# Patient Record
Sex: Female | Born: 1935 | Race: Black or African American | Hispanic: No | Marital: Single
Health system: Southern US, Community
[De-identification: ages and names within clinical notes are randomized; demographics above are authoritative.]

---

## 2005-04-30 ENCOUNTER — Ambulatory Visit: Payer: Self-pay | Admitting: Family Medicine

## 2006-05-01 ENCOUNTER — Ambulatory Visit: Payer: Self-pay | Admitting: Family Medicine

## 2008-11-21 ENCOUNTER — Emergency Department: Payer: Self-pay

## 2009-02-01 ENCOUNTER — Ambulatory Visit: Payer: Self-pay | Admitting: Family Medicine

## 2012-09-17 ENCOUNTER — Observation Stay: Payer: Self-pay | Admitting: Internal Medicine

## 2012-09-17 LAB — COMPREHENSIVE METABOLIC PANEL
Albumin: 3.4 g/dL (ref 3.4–5.0)
Alkaline Phosphatase: 69 U/L (ref 50–136)
Calcium, Total: 9.2 mg/dL (ref 8.5–10.1)
Chloride: 107 mmol/L (ref 98–107)
Co2: 24 mmol/L (ref 21–32)
Creatinine: 0.94 mg/dL (ref 0.60–1.30)
EGFR (African American): >60
EGFR (Non-African Amer.): 54 — ABNORMAL LOW
Glucose: 43 mg/dL — ABNORMAL LOW (ref 65–99)
Osmolality: 276 (ref 275–301)
SGPT (ALT): 16 U/L (ref 12–78)
Sodium: 139 mmol/L (ref 136–145)

## 2012-09-17 LAB — CBC
MCH: 28.8 pg (ref 26.0–34.0)
MCHC: 32.9 g/dL (ref 32.0–36.0)
RBC: 3.76 10*6/uL — ABNORMAL LOW (ref 3.80–5.20)
RDW: 15.8 % — ABNORMAL HIGH (ref 11.5–14.5)

## 2012-09-17 LAB — URINALYSIS, COMPLETE
Bilirubin,UR: NEGATIVE
Blood: NEGATIVE
Glucose,UR: 50 mg/dL (ref 0–75)
Nitrite: NEGATIVE
Protein: NEGATIVE
Specific Gravity: 1.008 (ref 1.003–1.030)
WBC UR: 1 /HPF (ref 0–5)

## 2012-09-17 LAB — CK TOTAL AND CKMB (NOT AT ARMC): CK, Total: 99 U/L (ref 21–215)

## 2012-09-18 LAB — BASIC METABOLIC PANEL
Anion Gap: 7 (ref 7–16)
BUN: 12 mg/dL (ref 7–18)
Calcium, Total: 8.3 mg/dL — ABNORMAL LOW (ref 8.5–10.1)
Chloride: 105 mmol/L (ref 98–107)
Co2: 23 mmol/L (ref 21–32)
Osmolality: 271 (ref 275–301)
Sodium: 135 mmol/L — ABNORMAL LOW (ref 136–145)

## 2012-09-18 LAB — CBC WITH DIFFERENTIAL/PLATELET
Basophil #: 0 10*3/uL (ref 0.0–0.1)
Basophil %: 0.5 %
Eosinophil %: 2.8 %
HCT: 32.5 % — ABNORMAL LOW (ref 35.0–47.0)
Lymphocyte #: 3 10*3/uL (ref 1.0–3.6)
Lymphocyte %: 34.4 %
MCH: 28.6 pg (ref 26.0–34.0)
Monocyte #: 0.8 x10 3/mm (ref 0.2–0.9)
Monocyte %: 9.3 %
Neutrophil #: 4.7 10*3/uL (ref 1.4–6.5)
Neutrophil %: 53 %
Platelet: 250 10*3/uL (ref 150–440)
RBC: 3.71 10*6/uL — ABNORMAL LOW (ref 3.80–5.20)

## 2012-09-18 LAB — LIPID PANEL: Triglycerides: 83 mg/dL (ref 0–200)

## 2012-09-18 LAB — HEMOGLOBIN A1C: Hemoglobin A1C: 8.2 % — ABNORMAL HIGH (ref 4.2–6.3)

## 2013-09-24 IMAGING — CR DG CHEST 1V PORT
1 series · 1 of 1 positions shown · non-contrast
Comparison: none

REASON FOR EXAM: weakness
COMMENTS:

PROCEDURE:     DXR - DXR PORTABLE CHEST SINGLE VIEW  - September 18, 2012  [DATE]
RESULT:     Comparison: 11/21/2008

[ap]
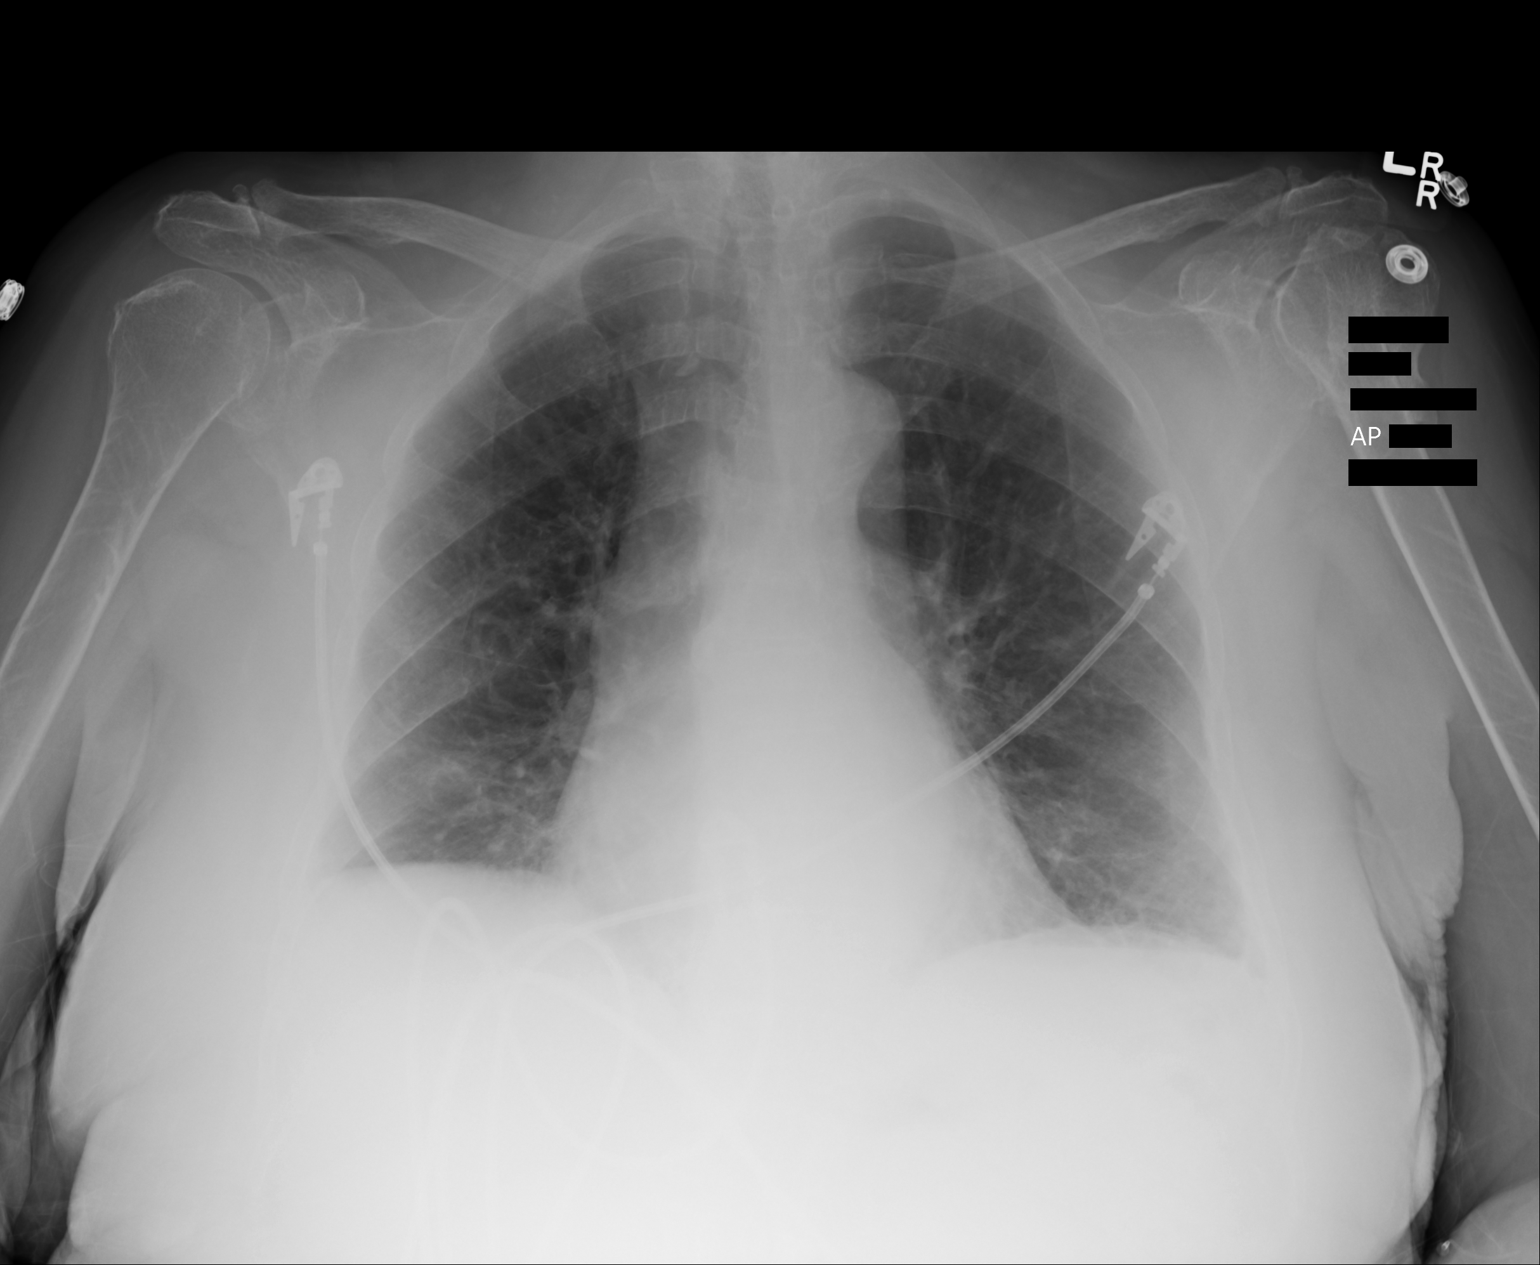

[1 of 1 positions shown; findings below may reference images not displayed]

FINDINGS: Single portable AP chest radiograph is provided.  There is no focal
parenchymal opacity, pleural effusion, or pneumothorax. Normal
cardiomediastinal silhouette. The osseous structures are unremarkable.
IMPRESSION: No acute disease of the che[REDACTED]

## 2014-10-21 NOTE — Consult Note (Signed)
Chief Complaint and History:  Referring Physician Dr. Jacques NavyAhmadzia   Chief Complaint Hypoglycemia   Allergies:  No Known Allergies:   Assessment/Plan:  Assessment/Plan Patient was seen, interviewed and examined. She has type 2 diabetes and is on a regimen of glipizide XL 10 mg bid and Victoza 1.8 sq daily. Diabetes is uncontrolled. Last A1c in 07/2012 was >9%. She does not monitor her sugars. Sugar was in the 40s yesterday afternoon and then again overnight so she came to ED. Sugars repeated dropped despite amps of dextrose in ED. She was started on IV D10 and is admitted for OBS. She confirms taking glipizide XL 10 mg yest AM, no other DM meds yesterday. not sure if she took only one glipizide. Lives with grandson but manages meds on her own.  A/ Hypoglycemia, likely due to use of sulfonylurea. May have taken extra? No evidence of infection. Does not seem to have demential.  P/ Hold DM meds Continue IV Dextrose. Once BS are >150 consistently, wean dextrose. Check FSBS q2 hours. Agree with NovoLog SSI in case sugars are high  I will follow with you. Full consult was dictated.   Electronic Signatures: Raj JanusSolum, Chlora Mcbain M (MD)  (Signed 20-Mar-14 13:41)  Authored: Chief Complaint and History, ALLERGIES, Assessment/Plan   Last Updated: 20-Mar-14 13:41 by Raj JanusSolum, Lailani Tool M (MD)

## 2014-10-21 NOTE — Consult Note (Signed)
PATIENT NAME:  Andrea Morse, Andrea Morse MR#:  [id number] DATE OF BIRTH:  02-07-1921  DATE OF CONSULTATION:  09/17/2012  REFERRING PHYSICIAN:  Vivien Presto, MD CONSULTING PHYSICIAN:  A. Lavone Orn, MD PRIMARY CARE PHYSICIAN: Dion Body, MD   CHIEF COMPLAINT: Hypoglycemia.   HISTORY OF PRESENT ILLNESS: This is an 81 year old female seen in consultation at the request of Dr. Bridgette Habermann with hypoglycemia. She has a history of hypertension, diabetes, hyperlipidemia. Her outpatient diabetes regimen includes glipizide extended-release 10 mg b.i.d. and Victoza 1.8 mg subcutaneous daily. She has been in her usual state of health and yesterday afternoon developed some weakness. This was around 1 to 2:00 p.m. Checked her blood sugars and found sugar had dropped into the 40s. She had something to eat. She was able to improve her blood sugar into the 70s. She ate breakfast, lunch and supper. That evening, blood sugars dropped again and she was woken from sleep with blood sugars in the 40s. She was brought to the ED where sugars were found again to be in the 40s. She was treated with D50 repeatedly; however, sugars would briefly improve and then drop again, so she was placed on D10 IV infusion. She is receiving D10 at a rate of 70 mL/h. She confirms she took her morning glipizide XL 10 mg. She did not take any glipizide in the evening and she did not take any Victoza yesterday, due to the concerns of low sugars. She denies any chest pain, shortness of breath or cough. She denies any recent fever. She denies dysuria or hematuria. She denies any recent abdominal pain or nausea, vomiting.   PAST MEDICAL HISTORY:  1.  Diabetes mellitus.  2.  Hypertension.  3.  Hyperlipidemia.  4.  Osteoporosis.  5.  Glaucoma.  ALLERGIES: No known drug allergies.   PAST SURGICAL HISTORY: Hysterectomy and oophorectomy.   OUTPATIENT MEDICATIONS:  1.  Victoza 1.8 mg subcutaneous daily.  2.  Glipizide extended-release 10 mg b.i.d.   3.  Alendronate 70 mg once weekly.  4.  HCTZ/moexipril 25/15 mg once daily.  5.  Simvastatin 10 mg at bedtime.  6.  Aspirin 81 mg daily.  7.  Flexeril 10 mg at bedtime p.r.n.   FAMILY HISTORY: Noncontributory.   SOCIAL HISTORY: The patient lives with a grandson. No tobacco or alcohol use.   REVIEW OF SYSTEMS:    HEENT: No blurred vision. No sore throat.  NECK: No neck pain or dysphagia.  CARDIAC: No chest pain. No palpitations.  PULMONARY: No cough. No shortness of breath.  ABDOMEN: No abdominal pain. Good appetite. No recent change in bowel habits.  EXTREMITIES: Denies leg swelling.  SKIN: Denies rash or pruritus.  ENDOCRINE: Denies heat or cold intolerance.  HEMATOLOGIC: Denies easy bruisability or recent bleeding.  NEUROLOGIC: Denies tremor or recent falls.   PHYSICAL EXAMINATION:  VITAL SIGNS: Height 62.9 inches, weight 140 pounds, BMI 24.8, temperature 97.7, pulse 80, respiratory rate 18, blood pressure 126/63, o2 sat 99% on room air.  GENERAL: Well-developed, well-nourished African American female in no acute distress.  HEENT: EOMI. Oropharynx is clear. Mucous membranes moist. Edentulous.  NECK: Supple. No thyromegaly.  CARDIAC: No carotid bruit. Regular rate and rhythm.  PULMONARY: Clear to auscultation bilaterally. No wheeze. Good inspiratory effort.  ABDOMEN: Diffusely soft, nontender, nondistended.  EXTREMITIES: No edema is present. Normal motor tone.  SKIN: No rash. No dermatopathy.  PSYCHIATRIC: Alert and oriented, calm and cooperative.   LABORATORY DATA: Glucose 43, BUN 16, creatinine 0.94, potassium 3.7, sodium 139,  eGFR greater than 60, calcium 9.2, CK 99, CK-MB 1.2, AST 19, ALT 16, albumin 3.4. WBC 11.5, hematocrit 33, platelets 262. Urinalysis notable for no ketones, bilirubin, nitrite, leukocyte esterase. Glucose in urine is 50 mg/dL. No urine protein.   RADIOLOGY: None.  ASSESSMENT: An 81 year old female with type 2 diabetes presenting with severe recurrent  hypoglycemia. Presumably this is due to use of sulfonylurea.   RECOMMENDATIONS:  1.  Hold all diabetes medications.  2.  Continue dextrose. Once blood sugars are consistently over 150 for 2 to 3 hours on her current rate, I would then decrease the rate to 50 mL/h and then titrate down every 2 to 3 hours as tolerated to keep blood sugars over 70.  3.  Encourage good p.o. intake.  4.  Should blood sugars remain stable over the next 24 hours, I would again keep her off her diabetes medications initially. I would counsel her on the importance of regular monitoring of blood sugars and then arrange for close outpatient followup within 1 week to then determine which medications could be restarted. I would tentatively anticipate not restarting the sulfonylurea. Victoza could be considered. Other reasonable diabetes medications to be considered could include metformin, pioglitazone, and the DPP-4 inhibitors (Januvia and Onglyza).   Thank you for the kind request for consultation. I will follow along with you.    ____________________________ A. Lavone Orn, MD ams:jm D: 09/17/2012 13:35:53 ET T: 09/17/2012 13:53:23 ET JOB#: [id number]  cc: A. Lavone Orn, MD, <Dictator> Sherlon Handing MD ELECTRONICALLY SIGNED 09/22/2012 12:51
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
## 2019-08-09 ENCOUNTER — Other Ambulatory Visit: Payer: Self-pay

## 2019-08-09 ENCOUNTER — Ambulatory Visit: Payer: Medicare Other | Attending: Internal Medicine

## 2019-08-09 DIAGNOSIS — Z23 Encounter for immunization: Secondary | ICD-10-CM | POA: Insufficient documentation

## 2019-08-09 NOTE — Progress Notes (Signed)
   Covid-19 Vaccination Clinic  Name:  Andrea Morse    MRN: [id number] DOB: 09-29-1920  08/09/2019  Andrea Morse was observed post Covid-19 immunization for 15 minutes without incidence. She was provided with Vaccine Information Sheet and instruction to access the V-Safe system.   Andrea Morse was instructed to call 911 with any severe reactions post vaccine: Marland Kitchen Difficulty breathing  . Swelling of your face and throat  . A fast heartbeat  . A bad rash all over your body  . Dizziness and weakness    Immunizations Administered    Name Date Dose VIS Date Route   Moderna COVID-19 Vaccine 08/09/2019 10:43 AM 0.5 mL 06/01/2019 Intramuscular   Manufacturer: Moderna   Lot: [id number]   NDC: 46190-122-24

## 2019-09-08 ENCOUNTER — Ambulatory Visit: Payer: Medicare Other | Attending: Internal Medicine

## 2019-09-08 DIAGNOSIS — Z23 Encounter for immunization: Secondary | ICD-10-CM | POA: Insufficient documentation

## 2019-09-08 NOTE — Progress Notes (Signed)
   Covid-19 Vaccination Clinic  Name:  Andrea Morse    MRN: [id number] DOB: [birth year]  09/08/2019  [name] was observed post Covid-19 immunization for 15 minutes without incident. She was provided with Vaccine Information Sheet and instruction to access the V-Safe system.   Ms. Min was instructed to call 911 with any severe reactions post vaccine: Marland Kitchen Difficulty breathing  . Swelling of face and throat  . A fast heartbeat  . A bad rash all over body  . Dizziness and weakness   Immunizations Administered    Name Date Dose VIS Date Route   Moderna COVID-19 Vaccine 09/08/2019  9:35 AM 0.5 mL 06/01/2019 Intramuscular   Manufacturer: Moderna   Lot: [id number]   NDC: 28366-294-76

## 2021-11-02 ENCOUNTER — Telehealth: Payer: Self-pay | Admitting: Nurse Practitioner

## 2021-11-02 NOTE — Telephone Encounter (Signed)
Spoke with patient's granddaughter [facility]) and we discussed the Palliative referral/services and she was in agreement with beginning services for patient.  I have scheduled a MyChart Consult for 11/08/21 @ 1:30 PM ?

## 2021-11-08 ENCOUNTER — Encounter: Payer: Self-pay | Admitting: Nurse Practitioner

## 2021-11-08 ENCOUNTER — Telehealth: Payer: Medicare Other | Admitting: Nurse Practitioner

## 2021-11-08 DIAGNOSIS — Z515 Encounter for palliative care: Secondary | ICD-10-CM

## 2021-11-08 DIAGNOSIS — R0602 Shortness of breath: Secondary | ICD-10-CM

## 2021-11-08 NOTE — Progress Notes (Addendum)
? ? ?Manufacturing engineer ?Community Palliative Care Consult Note ?Telephone: [phone]  ?Fax: [phone]  ? ? ?Date of encounter: 11/08/21 ?2:18 PM ?PATIENT NAME: Andrea Morse ?[address] ?[place] [address]   ?[phone] (home)  ?DOB: 04-Jul-1920 ?MRN: [id number] ?PRIMARY CARE PROVIDER:    ?Dion Body, MD ?[place] [facility] ?[place],  [place] [address] ?[phone] ? ?RESPONSIBLE PARTY:    ?Contact Information   ? ? Name Relation Home Work Mobile  ? Andrea Morse Granddaughter   [phone]  ? Andrea Morse  [phone]    ? ?  ? ?Due to the COVID-19 crisis, this visit was done via telemedicine from my office and it was initiated and consent by this patient and or family. ? ?I connected with Granddaughter, Andrea Morse with  Andrea Morse OR [employer] on 11/08/21 by a video enabled telemedicine application and verified that I am speaking with the correct person using two identifiers. ?  ?I discussed the limitations of evaluation and management by telemedicine. The patient expressed understanding and agreed to proceed.  Palliative Care was asked to follow this patient by consultation request of  Dion Body, MD to address advance care planning and complex medical decision making. This is a follow up visit.                              ?ASSESSMENT AND PLAN / RECOMMENDATIONS:  ?Symptom Management/Plan: ?1. Advance Care Planning;  DNR ?2. Goals of Care: Goals include to maximize quality of life and symptom management. Our advance care planning conversation included a discussion about:    ?The value and importance of advance care planning  ?Exploration of personal, cultural or spiritual beliefs that might influence medical decisions  ?Exploration of goals of care in the event of a sudden injury or illness  ?Identification and preparation of a healthcare agent  ?Review and updating or creation of an advance directive document. ?3. Shortness of breath, continues O2,  stable; will continue to monitor, follow, weights, edema. ?4. Palliative care encounter; Palliative care encounter; Palliative medicine team will continue to support patient, patient's family, and medical team. Visit consisted of counseling and education dealing with the complex and emotionally intense issues of symptom management and palliative care in the setting of serious and potentially life-threatening illness ? ?Follow up Palliative Care Visit: Palliative care will continue to follow for complex medical decision making, advance care planning, and clarification of goals. Return 3 weeks or prn. ? ?I spent 42 minutes providing this consultation. More than 50% of the time in this consultation was spent in counseling and care coordination. ?PPS: 40% ? ?Chief Complaint: Follow up palliative consult for complex medical decision making ? ?HISTORY OF PRESENT ILLNESS:  Andrea Morse is a 88 y.o. year old female  with multiple medical problems including DM, CKD, afib, anemia, HLD, osteoporosis, glaucoma, h/o sigmoid diverticulosis. I connected through my chart with Andrea Morse with Andrea Morse for f//u [facility] consult since Hospice d/c due to stability. Andrea Morse resides at home with her son Andrea Morse with granddaughter Andrea Morse visiting daily assisting with care. Andrea Morse is able to sit up on the side of the bed, continues to wear O2 supplemental. We talked about functional abilities, ros, symptoms, currently comfortable. No problems or concerns. [name] Andrea Morse is doing well eating, no Morse, no wounds, no infections, no recent hospitalizations. Medical goals reviewed, quality of life. Information given about Remote  Health incase more difficulty getting to primary provider's office. We talked about role pc in poc. Scheduled in person f/u visit in 3 weeks, [name] in agreement, scheduled. Therapeutic listening, emotional support provided. Questions answered ? ?Hospice d/c summary ?Female DOB:  December 18, 1920 ?Address: Phone: [phone] ?10-09-2021 ?DISCHARGE NARRATIVE ?SUMMARY: Andrea Morse is a 88 year old female with Hypertensive heart and chronic kidney disease stage 4, atrial fibrillation, DM ?type II, chronic hypoxic respiratory failure on supplemental oxygen, OP, glaucoma, and HLD. She progressively declined in function ?and cognition over the several months prior to hospice admission. She declined from Register 60% February 2021 when she was able to ?ambulate with a cane and have adequate oral intake, to PPS 40% on admission August 2021. She had increased visits made to her ?PCP over several months prior to hospice referral, with increased shortness of breath since July 2021, increased weakness and ?fatigue, spending > 50% of her day in bed, needing total care for ADLs and requiring bed baths on admission to hospice. Her urine ?output decreased and eGFR declined to 30 cc/min in July 2021. She declined to only eating 2 meals/day at recertification January ?2023 she would only eat 50% of dinner 3-4 days a week, and refusing to eat or only bites at other meals. Her appetite has since ?improved, and she now eats 100% of 3 meals per day with daily snacks. She lost weight from 124# February 2021 to 117# BMI ?22kg/m2 at [facility]" on hospice admission August 2021, lost to 109# November 2021, up to 111# February-April 2022, back down to ?109# June - August 2022, to 108# October 2022, up to 110# (with new scales) December 2022, to 106.4# January 2023, to now ?108.4# BMI 18.8kg/m2. Her MAC increased slightly from 22cm April 2022 to 23cm June 2022, then back down to 22cm ?August-October 2022, to 21.5cm since December 2022, and her thigh decreased from 38cm June 2022 to 36cm August 2022, to ?35.5cm October 2022, to 35cm December 2022, to 34cm January 2023. She is on continuous oxygen at 3LPM (was PRN December ?2021.) Her dyspnea has improved, and her oxygen saturation levels have been stable on 2LPM; She recently only  dropped O2 sats to ?86% on RA ambulating around room (previously would drop to low 80's on oxygen.) She is able to ambulate much better than at ?January 9675 recertification. Her PPS is 40%. She is not sleeping as much since seroquel was increased, now only sleeps 16 ?hours/day (improved from sleeping up to 18-20 hours per day at January 9163 recertification.) Her Stage 1 wound to heel has ?completely healed. She has had no Morse, no new wounds, nor infections; is more custodial care at this time than terminal. ?Granddaughter agrees with discharge, DR Netty Starring gave order for palliative care to follow on hospice discharge. ?Patient no longer qualifies for hospice evidenced by progressive clinical, cognitive, and functional improvement. She has improved ?appetite and subsequent weight gain, now weighs only 3# less now than she was last year at this time so weight is essentially stable. ?Her wounds have healed. She is more alert, sleeping less, and able to ambulate better, with stable lung function at this time, despite ?advanced age with multiple medical comorbid conditions. She no longer meets [facility] LCD for Hospice due to her recent ?improvements in multiple areas. ?I certify that this patient is under my care and to the best of my medical knowledge, given the data available, has a life expectancy of ?more than six months if the illness  runs its normal course. The information stated above is based on the review of the patient's clinical ?record and report received from the face to face visit, hospice nurse, and team. ? ?History obtained from review of EMR, discussion with granddaughter, Andrea Morse with Andrea Morse.  ?I reviewed available labs, medications, imaging, studies and related documents from the EMR.  Records reviewed and summarized above.  ? ?ROS ?10 point system reviewed all negative except HPI ? ?Physical Exam: ?deferred ? ?Thank you for the opportunity to participate in the care of Andrea Morse.   The palliative care team will continue to follow. Please call our office at [phone] if we can be of additional assistance.  ? ?Mi Balla Z Vivianne Carles, NP  ?  ?

## 2021-11-19 ENCOUNTER — Other Ambulatory Visit: Payer: Medicare Other | Admitting: Nurse Practitioner

## 2021-12-03 ENCOUNTER — Other Ambulatory Visit: Payer: Medicare Other | Admitting: Nurse Practitioner

## 2021-12-03 ENCOUNTER — Encounter: Payer: Self-pay | Admitting: Nurse Practitioner

## 2021-12-03 DIAGNOSIS — Z515 Encounter for palliative care: Secondary | ICD-10-CM

## 2021-12-03 DIAGNOSIS — R0602 Shortness of breath: Secondary | ICD-10-CM

## 2021-12-03 NOTE — Progress Notes (Signed)
[place] Consult Note Telephone: [phone]  Fax: [phone]    Date of encounter: 12/03/21 12:08 PM PATIENT NAME: Andrea Morse [place] [address]   [phone] (home)  DOB: February 19, 1921 MRN: [id number] PRIMARY CARE PROVIDER:    Dr Andrea Morse RESPONSIBLE PARTY:    Contact Information     Name Relation Home Work Mobile   Morse,Andrea Morse   [phone]   Andrea, HOESCHEN  [phone]        I met face to face with patient and family in home. Palliative Care was asked Dr Andrea Morse to follow this patient by consultation request to address advance care planning and complex medical decision making. This is a follow up visit.                                  ASSESSMENT AND PLAN / RECOMMENDATIONS:  Symptom Management/Plan: 1. Advance Care Planning;  DNR 2. Shortness of breath, stable, continues O2, stable; will continue to monitor, follow, weights, edema 3. Palliative care encounter; Palliative care encounter; Palliative medicine team will continue to support patient, patient's family, and medical team. Visit consisted of counseling and education dealing with the complex and emotionally intense issues of symptom management and palliative care in the setting of serious and potentially life-threatening illness  Follow up Palliative Care Visit: Palliative care will continue to follow for complex medical decision making, advance care planning, and clarification of goals. Return 8 weeks or prn.   I spent 42 minutes providing this consultation. More than 50% of the time in this consultation was spent in counseling and care coordination. PPS: 40%   Chief Complaint: Follow up palliative consult for complex medical decision making   HISTORY OF PRESENT ILLNESS:  Andrea Morse is a 88 y.o. year old female  with multiple medical problems including DM, CKD, afib, anemia, HLD, osteoporosis, glaucoma, h/o sigmoid  diverticulosis. I Andrea Morse to confirm flu PC visit, in agreement. Andrea Morse with Andrea. Andrea Morse for f//u [facility] consult since Hospice d/c due to stability. Andrea Morse continues to resides at home with her son Andrea Morse with granddaughter Andrea Morse visiting daily assisting with care. I visited and observed Andrea Morse lying in bed in her room, sleeping, appears comfortable. Andrea Morse appears comfortable, continuous O2. We talked about functional abilities, continues to be able to sit up on the side of the bed, transfer to [facility]. We talked about ros, symptoms, including pain which she is not experiencing, shortness of breath with no recent increase in symptoms. Currently Andrea Morse appears comfortable. Andrea Morse endorses Andrea Morse is doing well eating, most meals. Andrea Morse eats a good breakfast and lunch, dinner sometimes. Andrea Morse does drink supplements. Andrea Morse endorses no Morse, no wounds, no infections, no recent hospitalizations. Medical goals reviewed, quality of life. Andrea Morse awoke, interactive, answered simple questions. We talked about difficulty with memory, though she remembers her children. We talked about chronic disease progression, currently Andrea Morse is stable. We talked about role pc in poc. Scheduled in person f/u visit in 8 weeks, Andrea Morse in agreement, scheduled. Therapeutic listening, emotional support provided. Questions answered   Thank you for the opportunity to participate in the care of Andrea. Andrea Morse.  The palliative care team will continue to follow. Please call our office at [phone] if we can be of additional assistance.   Andrea Morse Andrea Gully, NP

## 2022-02-07 ENCOUNTER — Encounter: Payer: Self-pay | Admitting: Nurse Practitioner

## 2022-02-07 ENCOUNTER — Telehealth: Payer: Self-pay | Admitting: Nurse Practitioner

## 2022-02-07 ENCOUNTER — Telehealth: Payer: Medicare Other | Admitting: Nurse Practitioner

## 2022-02-07 NOTE — Telephone Encounter (Signed)
I attempted to contact for f/u pc visit, no answer though provider time was late from previous appointment. Message left to return call with contact information.

## 2022-02-13 ENCOUNTER — Telehealth: Payer: Medicare Other | Admitting: Nurse Practitioner

## 2022-02-13 DIAGNOSIS — R451 Restlessness and agitation: Secondary | ICD-10-CM

## 2022-02-13 DIAGNOSIS — Z515 Encounter for palliative care: Secondary | ICD-10-CM

## 2022-02-13 DIAGNOSIS — R0602 Shortness of breath: Secondary | ICD-10-CM

## 2022-02-14 ENCOUNTER — Ambulatory Visit: Payer: Medicare Other

## 2022-02-14 ENCOUNTER — Encounter: Payer: Self-pay | Admitting: Nurse Practitioner

## 2022-02-14 DIAGNOSIS — Z789 Other specified health status: Secondary | ICD-10-CM

## 2022-02-14 NOTE — Progress Notes (Signed)
[place] Consult Note Telephone: [phone]  Fax: [phone]    Date of encounter: 02/14/22 9:19 AM PATIENT NAME: Andrea Morse [id number]   [phone] (home)  DOB: 26-Mar-1921 MRN: [id number] PRIMARY CARE PROVIDER:    Dr Netty Starring RESPONSIBLE PARTY:      Leitha Bleak     [phone]    [place]   [phone]         Due to the COVID-19 crisis, this visit was done via telemedicine from my office and it was initiated and consent by this patient and or family.  I connected with Granddaughter, Ms Andrea Morse with  Andrea Morse [facility] on 02/14/22 by a video enabled telemedicine application and verified that I am speaking with the correct person.   I discussed the limitations of evaluation and management by telemedicine. The patient expressed understanding and agreed to proceed. Palliative Care was asked to follow this patient by Dr Netty Starring consultation to address advance care planning and complex medical decision making. This is a follow up visit.                                  ASSESSMENT AND PLAN / RECOMMENDATIONS:  Symptom Management/Plan: 1. Advance Care Planning;  DNR  2. Agitation intermit symptoms currently controlled, discussed at length with Andrea Morse, requests for haldol as previously used when Andrea Morse was under hospice for episodes of agitation, combativeness. Discussed at length and will refill haldol  Rx: Haldol 16m po q6hrs as needed for agitation; #20; No RF  3. Shortness of breath, continues O2, stable; will continue to monitor, follow, weights, edema.  4. Palliative care encounter; Palliative care encounter; Palliative medicine team will continue to support patient, patient's family, and medical team. Visit consisted of counseling and education dealing with the complex and emotionally intense issues of symptom management and palliative care in the setting of  serious and potentially life-threatening illness   Follow up Palliative Care Visit: Palliative care will continue to follow for complex medical decision making, advance care planning, and clarification of goals. Return 3 weeks or prn.   I spent 42 minutes providing this consultation. More than 50% of the time in this consultation was spent in counseling and care coordination. PPS: 40%   Chief Complaint: Follow up palliative consult for complex medical decision making   HISTORY OF PRESENT ILLNESS:  Andrea Morse a 88y.o. year old female  with multiple medical problems including DM, CKD, afib, anemia, HLD, osteoporosis, glaucoma, h/o sigmoid diverticulosis. I connected through my chart with Andrea Morse with [name] [facility] due to stability. I connected with Andrea Morse for telemedicine f/u pc consult. We talked about how Andrea Morse has been doing. Ms Andrea Morse and Andrea Morse "doing well". Ms. Andrea Hammanwas on video eating her breakfast, on O2, appears debilitated pleasant female. We talked about ros, appetite which has been good. We talked about nutrition. We talked about no noted weight loss. We talked further about agitation. Ms. Andrea Hammanendorses when under hospice would give on occasion ativan or haldol for severe agitation. Ms. Andrea Hammanendorses she no longer has anything for agitation and last week Ms. ATunnellbecame very agitated, uncooperative, combative. We talked about use of ativan, haldol, medications reviewed. Discussed will use haldol and send in rx. We talked about no new functional or  cognitive changes. SOB is stable. No further needs at this time. Medical goals reviewed, f/u pc visit scheduled. Ms Andrea Morse in agreement, scheduled. Therapeutic listening, emotional support provided. 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 She progressively declined in function and cognition over the several months prior to hospice admission. She declined from [place] 60% February 2021 when she was able to ambulate with a cane and have adequate oral intake, to PPS 40% on admission August 2021. She had increased visits made to her PCP over several months prior to hospice referral, with increased shortness of breath since July 2021, increased weakness and fatigue, spending > 50% of her day in bed, needing total care for ADLs and requiring bed baths on admission to hospice. Her urine output decreased and eGFR declined to 30 cc/min in July 2021. She declined to only eating 2 meals/day at recertification January 2023 she would only eat 50% of dinner 3-4 days a week, and refusing to eat or only bites at other meals. Her appetite has since improved, and she now eats 100% of 3 meals per day with daily snacks. She lost weight from 124# February 2021 to 117# BMI 22kg/m2 at 5'3" on hospice admission August 2021, lost to 109# November 2021, up to 111# February-April 2022, back down to 109# June - August 2022, to 108# October 2022, up to 110# (with new scales) December 2022, to 106.4# January 2023, to now 108.4# BMI 18.8kg/m2. Her MAC increased slightly from 22cm April 2022 to 23cm June 2022, then back down to 22cm August-October 2022, to 21.5cm since December 2022, and her thigh decreased from 38cm June 2022 to 36cm August 2022, to 35.5cm October 2022, to 35cm December 2022, to 34cm January 2023. She is on continuous oxygen at 3LPM (was PRN December 2021.) Her dyspnea has improved, and her oxygen saturation levels have been stable on 2LPM; She recently only dropped O2 sats to 86% on RA ambulating around room (previously would drop to low 80's on oxygen.) She is able to ambulate much  better than at January 5631 recertification. Her PPS is 40%. She is not sleeping as much since seroquel was increased, now only sleeps 16 hours/day (improved from sleeping up to 18-20 hours per day at January 4970 recertification.) Her Stage 1 wound to heel has completely healed. She has had no Andrea Morse, no new wounds, nor infections; is more custodial care at this time than terminal. Granddaughter agrees with discharge, DR Netty Starring gave order for palliative care to follow on hospice discharge. Patient no longer qualifies for hospice evidenced by progressive clinical, cognitive, and functional improvement. She has improved appetite and subsequent weight gain, now weighs only 3# less now than she was last year at this time so weight is essentially stable. Her wounds have healed. She is more alert, sleeping less, and able to ambulate better, with stable lung function at this time, despite advanced age with multiple medical comorbid conditions. She no longer meets [facility] LCD for Hospice due to her recent improvements in multiple areas. I certify that this patient is under my care and to the best of my medical knowledge, given the data available, has a life expectancy of more than six months if the illness runs its normal course. The  information stated above is based on the review of the patient's clinical record and report received from the face to face visit, hospice nurse, and team." From [facility]   History obtained from review of EMR, discussion with granddaughter, Andrea Morse with Ms. Ebony Morse.  I reviewed available labs, medications, imaging, studies and related documents from the EMR.  Records reviewed and summarized above.    ROS 10 point system reviewed all negative except HPI   Physical Exam: deferred   Thank you for the opportunity to participate in the care of Ms. Ebony Morse.  The palliative care team will continue to follow. Please call our office at [phone] if we  can be of additional assistance.   Arrianna Catala Ihor Gully, NP

## 2022-02-14 NOTE — Progress Notes (Signed)
(  1000) PC SW outreached patients granddaughter, Burnard Hawthorne, to discuss questions about prescription drug plans, per [facility] NP - C. Gusler Sw referral request.  Granddaughter shared that patient discharged from hospice services recently and since being Dc'd she has not had prescription drug coverage. Granddaughter sdhred that she outreached patients previous prescription drug coverage plan provider and was told that she had to wait until re-enrollment period. Patient has had to pay OOP for medications since being discharged from hospice.    (1100) SW outreached Medicare directly by TC to acquire about their Special enroll;ment periods qualification. SW was told that DC from home hospice services did not qualify fro  SEP and that patient could call 1-800 MEDICARE directly to state their case/argument or wait until open enrollment period in Oct.    (330) SW outreached granddaughter again to make her aware of info received from [employer] rep. Granddaughter appreciative of follow up and stated that she will outreach 1-800 MEDICARE to state patients case. SW emailed granddaughter the following resources to assist with medication cost as well.  https://www.healthwellfoundation.org/fund/type-2-diabetes/  https://www.healthwellfoundation.org/fund/neurocognitive-disease-with-psychosis-medicare-access/  https://www.needymeds.org/papforms/rxopae0367.pdf

## 2022-03-14 ENCOUNTER — Telehealth: Payer: Medicare Other | Admitting: Nurse Practitioner

## 2022-04-18 ENCOUNTER — Other Ambulatory Visit: Payer: Medicare Other | Admitting: Nurse Practitioner

## 2022-05-31 DEATH — deceased
